# Patient Record
Sex: Male | Born: 1990 | Race: Black or African American | Hispanic: No | Marital: Single | Smoking: Never smoker
Health system: Southern US, Community
[De-identification: ages and names within clinical notes are randomized; demographics above are authoritative.]

## PROBLEM LIST (undated history)

## (undated) DIAGNOSIS — I10 Essential (primary) hypertension: Secondary | ICD-10-CM

## (undated) HISTORY — DX: Essential (primary) hypertension: I10

---

## 2013-04-20 ENCOUNTER — Emergency Department (HOSPITAL_COMMUNITY)
Admission: EM | Admit: 2013-04-20 | Discharge: 2013-04-20 | Disposition: A | Discharge status: Home / Self Care | Payer: Self-pay | Attending: Emergency Medicine | Admitting: Emergency Medicine

## 2013-04-20 ENCOUNTER — Encounter (HOSPITAL_COMMUNITY): Payer: Self-pay | Admitting: Emergency Medicine

## 2013-04-20 DIAGNOSIS — S0993XA Unspecified injury of face, initial encounter: Secondary | ICD-10-CM | POA: Insufficient documentation

## 2013-04-20 DIAGNOSIS — Y9389 Activity, other specified: Secondary | ICD-10-CM | POA: Insufficient documentation

## 2013-04-20 DIAGNOSIS — Y92410 Unspecified street and highway as the place of occurrence of the external cause: Secondary | ICD-10-CM | POA: Insufficient documentation
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
## 2013-04-21 NOTE — ED Provider Notes (Signed)
Medical screening examination/treatment/procedure(s) were performed by non-physician practitioner and as supervising physician I was immediately available for consultation/collaboration.   Derriona Branscom E Laquanta Hummel, MD 04/21/13 1313 

## 2016-01-07 ENCOUNTER — Ambulatory Visit (HOSPITAL_COMMUNITY)
Admission: EM | Admit: 2016-01-07 | Discharge: 2016-01-07 | Disposition: A | Discharge status: Home / Self Care | Payer: 59 | Attending: Emergency Medicine | Admitting: Emergency Medicine

## 2016-01-07 ENCOUNTER — Encounter (HOSPITAL_COMMUNITY): Payer: Self-pay

## 2016-01-07 DIAGNOSIS — Z202 Contact with and (suspected) exposure to infections with a predominantly sexual mode of transmission: Secondary | ICD-10-CM | POA: Diagnosis present

## 2016-01-07 DIAGNOSIS — N342 Other urethritis: Secondary | ICD-10-CM | POA: Diagnosis not present

## 2016-01-07 MED ORDER — AZITHROMYCIN 250 MG PO TABS
1000.0000 mg | ORAL_TABLET | Freq: Once | ORAL | Status: AC
  Administered 2016-01-07: 1000 mg via ORAL

## 2016-01-07 MED ORDER — CEFTRIAXONE SODIUM 250 MG IJ SOLR
INTRAMUSCULAR | Status: AC
  Filled 2016-01-07: qty 250

## 2016-01-07 MED ORDER — LIDOCAINE HCL (PF) 1 % IJ SOLN
INTRAMUSCULAR | Status: AC
  Filled 2016-01-07: qty 5

## 2016-01-07 MED ORDER — CEFTRIAXONE SODIUM 250 MG IJ SOLR
250.0000 mg | INTRAMUSCULAR | Status: DC
  Administered 2016-01-07: 250 mg via INTRAMUSCULAR

## 2016-01-07 MED ORDER — AZITHROMYCIN 250 MG PO TABS
ORAL_TABLET | ORAL | Status: AC
  Filled 2016-01-07: qty 4

## 2016-01-07 NOTE — Discharge Instructions (Signed)
Urethritis, Adult °Urethritis is an inflammation of the tube through which urine exits your bladder (urethra).  °CAUSES °Urethritis is often caused by an infection in your urethra. The infection can be viral, like herpes. The infection can also be bacterial, like gonorrhea. °RISK FACTORS °Risk factors of urethritis include: °· Having sex without using a condom. °· Having multiple sexual partners. °· Having poor hygiene. °SIGNS AND SYMPTOMS °Symptoms of urethritis are less noticeable in women than in men. These symptoms include: °· Burning feeling when you urinate (dysuria). °· Discharge from your urethra. °· Blood in your urine (hematuria). °· Urinating more than usual. °DIAGNOSIS  °To confirm a diagnosis of urethritis, your health care provider will do the following: °· Ask about your sexual history. °· Perform a physical exam. °· Have you provide a sample of your urine for lab testing. °· Use a cotton swab to gently collect a sample from your urethra for lab testing. °TREATMENT  °It is important to treat urethritis. Depending on the cause, untreated urethritis may lead to serious genital infections and possibly infertility. Urethritis caused by a bacterial infection is treated with antibiotic medicine. All sexual partners must be treated.  °HOME CARE INSTRUCTIONS °· Do not have sex until the test results are known and treatment is completed, even if your symptoms go away before you finish treatment. °· If you were prescribed an antibiotic, finish it all even if you start to feel better. °SEEK MEDICAL CARE IF:  °· Your symptoms are not improved in 3 days. °· Your symptoms are getting worse. °· You develop abdominal pain or pelvic pain (in women). °· You develop joint pain. °· You have a fever. °SEEK IMMEDIATE MEDICAL CARE IF:  °· You have severe pain in the belly, back, or side. °· You have repeated vomiting. °MAKE SURE YOU: °· Understand these instructions. °· Will watch your condition. °· Will get help right away  if you are not doing well or get worse. °  °This information is not intended to replace advice given to you by your health care provider. Make sure you discuss any questions you have with your health care provider. °  °Document Released: 02/15/2001 Document Revised: 01/06/2015 Document Reviewed: 04/22/2013 °Elsevier Interactive Patient Education ©2016 [employer] Inc[employer] °Safe Sex °Safe sex is about reducing the risk of giving or getting a sexually transmitted disease (STD). STDs are spread through sexual contact involving the genitals, mouth, or rectum. Some STDs can be cured and others cannot. Safe sex can also prevent unintended pregnancies.  °WHAT ARE SOME SAFE SEX PRACTICES? °· Limit your sexual activity to only one partner who is having sex with only you. °· Talk to your partner about his or her past partners, past STDs, and drug use. °· Use a condom every time you have sexual intercourse. This includes vaginal, oral, and anal sexual activity. Both females and males should wear condoms during oral sex. Only use latex or polyurethane condoms and water-based lubricants. Using petroleum-based lubricants or oils to lubricate a condom will weaken the condom and increase the chance that it will break. The condom should be in place from the beginning to the end of sexual activity. Wearing a condom reduces, but does not completely eliminate, your risk of getting or giving an STD. STDs can be spread by contact with infected body fluids and skin. °· Get vaccinated for hepatitis B and HPV. °· Avoid alcohol and recreational drugs, which can affect your judgment. You may forget to use a condom or participate   in high-risk sex. °· For females, avoid douching after sexual intercourse. Douching can spread an infection farther into the reproductive tract. °· Check your body for signs of sores, blisters, rashes, or unusual discharge. See your health care provider if you notice any of these signs. °· Avoid sexual contact if you have  symptoms of an infection or are being treated for an STD. If you or your partner has herpes, avoid sexual contact when blisters are present. Use condoms at all other times. °· If you are at risk of being infected with HIV, it is recommended that you take a prescription medicine daily to prevent HIV infection. This is called pre-exposure prophylaxis (PrEP). You are considered at risk if: °¨ You are a man who has sex with other men (MSM). °¨ You are a heterosexual man or woman who is sexually active with more than one partner. °¨ You take drugs by injection. °¨ You are sexually active with a partner who has HIV. °· Talk with your health care provider about whether you are at high risk of being infected with HIV. If you choose to begin PrEP, you should first be tested for HIV. You should then be tested every 3 months for as long as you are taking PrEP. °· See your health care provider for regular screenings, exams, and tests for other STDs. Before having sex with a new partner, each of you should be screened for STDs and should talk about the results with each other. °WHAT ARE THE BENEFITS OF SAFE SEX?  °· There is less chance of getting or giving an STD. °· You can prevent unwanted or unintended pregnancies. °· By discussing safe sex concerns with your partner, you may increase feelings of intimacy, comfort, trust, and honesty between the two of you. °  °This information is not intended to replace advice given to you by your health care provider. Make sure you discuss any questions you have with your health care provider. °  °Document Released: 09/29/2004 Document Revised: 09/12/2014 Document Reviewed: 02/13/2012 °Elsevier Interactive Patient Education ©2016 Elsevier Inc. ° °

## 2016-01-07 NOTE — ED Notes (Signed)
Patient presents with possible STD exposure and would like to have STD test. Patient states he has been having a thick white discharge x2 days and painful urination. No acute distress

## 2016-01-07 NOTE — ED Provider Notes (Addendum)
CSN: [id number]     Arrival date & time 01/07/16  1818 History   First MD Initiated Contact with Patient 01/07/16 1927     Chief Complaint  Patient presents with  . Exposure to STD   (Consider location/radiation/quality/duration/timing/severity/associated sxs/prior Treatment) Patient is a 25 y.o. male presenting with STD exposure. The history is provided by the patient. No language interpreter was used.  Exposure to STD This is a new problem. The current episode started 2 days ago. The problem occurs constantly. The problem has not changed since onset.Pertinent negatives include no headaches. Nothing aggravates the symptoms. Nothing relieves the symptoms. He has tried nothing for the symptoms. The treatment provided no relief.  Pt complains of white discharge  History reviewed. No pertinent past medical history. History reviewed. No pertinent past surgical history. No family history on file. Social History  Substance Use Topics  . Smoking status: Never Smoker   . Smokeless tobacco: Never Used  . Alcohol Use: No    Review of Systems  Neurological: Negative for headaches.  All other systems reviewed and are negative.   Allergies  Review of patient's allergies indicates no known allergies.  Home Medications   Prior to Admission medications   Not on File   Meds Ordered and Administered this Visit   Medications  azithromycin (ZITHROMAX) tablet 1,000 mg (1,000 mg Oral Given 01/07/16 2015)  Pt given zithromx and Rocephin due to possible std.  BP 145/87 mmHg  Pulse 66  Temp(Src) 97.9 F (36.6 C) (Oral)  SpO2 99% No data found.   Physical Exam  Constitutional: He is oriented to person, place, and time. He appears well-developed and well-nourished.  HENT:  Head: Normocephalic.  Eyes: EOM are normal.  Neck: Normal range of motion.  Pulmonary/Chest: Effort normal.  Abdominal: He exhibits no distension.  Genitourinary: Prostate normal.  circumsized no lesions,    Musculoskeletal: Normal range of motion.  Neurological: He is alert and oriented to person, place, and time.  Psychiatric: He has a normal mood and affect.  Nursing note and vitals reviewed.   ED Course  Procedures (including critical care time)  Labs Review Labs Reviewed  URINE CYTOLOGY ANCILLARY ONLY    Imaging Review No results found.   Visual Acuity Review  Right Eye Distance:   Left Eye Distance:   Bilateral Distance:    Right Eye Near:   Left Eye Near:    Bilateral Near:         MDM   1. Urethritis    Meds ordered this encounter  Medications  . cefTRIAXone (ROCEPHIN) injection 250 mg    Sig:     Order Specific Question:  Antibiotic Indication:    Answer:  STD  . azithromycin (ZITHROMAX) tablet 1,000 mg    Sig:   An After Visit Summary was printed and given to the patient.    Elson AreasLeslie K Sofia, PA-C 01/07/16 [address]2007  Leslie K SumnerSofia, [place] JerseyPA-C 01/12/16 [phone]

## 2016-01-09 LAB — URINE CYTOLOGY ANCILLARY ONLY
Chlamydia: NEGATIVE
Neisseria Gonorrhea: POSITIVE — AB
Trichomonas: NEGATIVE

## 2016-01-11 ENCOUNTER — Telehealth (HOSPITAL_COMMUNITY): Payer: Self-pay | Admitting: Emergency Medicine

## 2016-01-11 NOTE — ED Notes (Signed)
LM on pt's VM [phone] Need to give lab results from recent visit on 5/4 Also let pt know labs can be obtained from MyChart  Per Dr. Dayton ScrapeMurray,  Clinical staff, please let patient and health department know that test for gonorrhea was positive.  [facility] with IM rocephin 250mg  and po zithromax 1g, so no further treatment is needed at this time.  Patient should make sexual partners aware of need for testing/treatment. LM   [facility]

## 2016-01-13 NOTE — ED Notes (Signed)
Called pt and notified of recent lab results from visit 5/4 Pt ID'd properly... Reports feeling better and sx have subsided... Has notified partner/s  Per Dr. Dayton ScrapeMurray,  Clinical staff, please let patient and health department know that test for gonorrhea was positive.  [facility], with IM rocephin 250mg  and po zithromax 1g, so no further treatment is needed at this time.  Patient should make sexual partners aware of need for testing/treatment. LM  Adv pt if sx are not getting better to return  Pt verb understanding Education on safe sex given [facility] already.

## 2016-09-07 ENCOUNTER — Ambulatory Visit (HOSPITAL_COMMUNITY)
Admission: EM | Admit: 2016-09-07 | Discharge: 2016-09-07 | Disposition: A | Discharge status: Home / Self Care | Payer: 59 | Attending: Emergency Medicine | Admitting: Emergency Medicine

## 2016-09-07 ENCOUNTER — Encounter (HOSPITAL_COMMUNITY): Payer: Self-pay | Admitting: Emergency Medicine

## 2016-09-07 DIAGNOSIS — R1030 Lower abdominal pain, unspecified: Secondary | ICD-10-CM | POA: Insufficient documentation

## 2016-09-07 DIAGNOSIS — Z79899 Other long term (current) drug therapy: Secondary | ICD-10-CM | POA: Insufficient documentation

## 2016-09-07 DIAGNOSIS — N410 Acute prostatitis: Secondary | ICD-10-CM | POA: Diagnosis not present

## 2016-09-07 LAB — POCT URINALYSIS DIP (DEVICE)
BILIRUBIN URINE: NEGATIVE
Glucose, UA: NEGATIVE mg/dL
Hgb urine dipstick: NEGATIVE
Ketones, ur: NEGATIVE mg/dL
LEUKOCYTES UA: NEGATIVE
NITRITE: NEGATIVE
Protein, ur: NEGATIVE mg/dL
Specific Gravity, Urine: 1.025 (ref 1.005–1.030)
Urobilinogen, UA: >=8 mg/dL (ref 0.0–1.0)
pH: 6 (ref 5.0–8.0)

## 2016-09-07 MED ORDER — CIPROFLOXACIN HCL 500 MG PO TABS: 500.0000 mg | ORAL_TABLET | Freq: Two times a day (BID) | ORAL | 0 refills | Status: DC

## 2016-09-07 NOTE — ED Provider Notes (Signed)
CSN: [id number]     Arrival date & time 09/07/16  1709 History   First MD Initiated Contact with Patient 09/07/16 1826     Chief Complaint  Patient presents with  . Groin Pain   (Consider location/radiation/quality/duration/timing/severity/associated sxs/prior Treatment) Patient c/o painful intercourse for 2 weeks.  He denies any urethritis or urethral DC.   The history is provided by the patient.  Groin Pain  This is a new problem. The current episode started more than 1 week ago. The problem occurs constantly. The problem has not changed since onset.The symptoms are aggravated by intercourse. Nothing relieves the symptoms.    History reviewed. No pertinent past medical history. History reviewed. No pertinent surgical history. History reviewed. No pertinent family history. Social History  Substance Use Topics  . Smoking status: Never Smoker  . Smokeless tobacco: Never Used  . Alcohol use No    Review of Systems  Constitutional: Negative.   HENT: Negative.   Eyes: Negative.   Respiratory: Negative.   Cardiovascular: Negative.   Gastrointestinal: Negative.   Endocrine: Negative.   Genitourinary: Positive for discharge.  Musculoskeletal: Negative.   Allergic/Immunologic: Negative.   Neurological: Negative.   Hematological: Negative.   Psychiatric/Behavioral: Negative.     Allergies  Patient has no known allergies.  Home Medications   Prior to Admission medications   Medication Sig Start Date End Date Taking? Authorizing Provider  ciprofloxacin (CIPRO) 500 MG tablet Take 1 tablet (500 mg total) by mouth 2 (two) times daily. 09/07/16   Deatra CanterWilliam J Oxford, FNP   Meds Ordered and Administered this Visit  Medications - No data to display  BP 139/71 (BP Location: Left Arm)   Pulse 73   Temp 98.4 F (36.9 C) (Oral)   Resp 18   SpO2 100%  No data found.   Physical Exam  Constitutional: He appears well-developed and well-nourished.  HENT:  Head: Normocephalic and  atraumatic.  Eyes: EOM are normal. Pupils are equal, round, and reactive to light.  Neck: Normal range of motion. Neck supple.  Cardiovascular: Normal rate, regular rhythm and normal heart sounds.   Pulmonary/Chest: Effort normal and breath sounds normal.  Abdominal: Soft. Bowel sounds are normal.  Genitourinary: Penis normal.  Nursing note and vitals reviewed.   Urgent Care Course   Clinical Course     Procedures (including critical care time)  Labs Review Labs Reviewed  URINE CULTURE  POCT URINALYSIS DIP (DEVICE)  URINE CYTOLOGY ANCILLARY ONLY    Imaging Review No results found.   Visual Acuity Review  Right Eye Distance:   Left Eye Distance:   Bilateral Distance:    Right Eye Near:   Left Eye Near:    Bilateral Near:         MDM   1. Acute prostatitis    cipro 500mg  po bid x 14 days #28  UA UACX UA cytology - GC chlamydia, trich      Deatra CanterWilliam J Oxford, FNP 09/07/16 [phone]

## 2016-09-07 NOTE — ED Notes (Signed)
Clean & dirty urine specimen in lab

## 2016-09-07 NOTE — ED Triage Notes (Signed)
[facility] a [facility]. The patient denied any dysuria or penile discharge. This has been ongoing for 2 weeks.

## 2016-09-07 NOTE — ED Notes (Signed)
Clean and dirty urine collected

## 2016-09-08 LAB — URINE CYTOLOGY ANCILLARY ONLY
Chlamydia: NEGATIVE
Neisseria Gonorrhea: NEGATIVE
Trichomonas: NEGATIVE

## 2016-09-08 LAB — URINE CULTURE: Culture: NO GROWTH

## 2016-09-26 ENCOUNTER — Encounter (HOSPITAL_COMMUNITY): Payer: Self-pay | Admitting: Family Medicine

## 2016-09-26 ENCOUNTER — Ambulatory Visit (HOSPITAL_COMMUNITY)
Admission: EM | Admit: 2016-09-26 | Discharge: 2016-09-26 | Disposition: A | Discharge status: Home / Self Care | Payer: 59 | Attending: Family Medicine | Admitting: Family Medicine

## 2016-09-26 DIAGNOSIS — R197 Diarrhea, unspecified: Secondary | ICD-10-CM

## 2016-09-26 NOTE — Discharge Instructions (Signed)
Take probiotics otc

## 2016-09-26 NOTE — ED Triage Notes (Signed)
Pt here for diarrhea over the past 2 weeks. sts some relief with pepto bismol. Denies vomiting.

## 2016-09-26 NOTE — ED Provider Notes (Signed)
CSN: [id number]     Arrival date & time 09/26/16  1353 History   First MD Initiated Contact with Patient 09/26/16 1440     Chief Complaint  Patient presents with  . Diarrhea   (Consider location/radiation/quality/duration/timing/severity/associated sxs/prior Treatment) Patient c/o diarrhea that has been improving but today it became worst.   The history is provided by the patient.  Diarrhea  Quality:  Watery Severity:  Moderate Onset quality:  Sudden Duration:  2 weeks Timing:  Intermittent Progression:  Improving Relieved by:  Nothing Worsened by:  Nothing   History reviewed. No pertinent past medical history. History reviewed. No pertinent surgical history. History reviewed. No pertinent family history. Social History  Substance Use Topics  . Smoking status: Never Smoker  . Smokeless tobacco: Never Used  . Alcohol use No    Review of Systems  Constitutional: Negative.   HENT: Negative.   Eyes: Negative.   Respiratory: Negative.   Cardiovascular: Negative.   Gastrointestinal: Positive for diarrhea.  Endocrine: Negative.   Genitourinary: Negative.   Musculoskeletal: Negative.   Allergic/Immunologic: Negative.   Neurological: Negative.   Hematological: Negative.   Psychiatric/Behavioral: Negative.     Allergies  Patient has no known allergies.  Home Medications   Prior to Admission medications   Not on File   Meds Ordered and Administered this Visit  Medications - No data to display  BP 131/75   Pulse 75   Temp 97.8 F (36.6 C)   Resp 18   SpO2 100%  No data found.   Physical Exam  Constitutional: He is oriented to person, place, and time. He appears well-developed and well-nourished.  HENT:  Head: Normocephalic and atraumatic.  Right Ear: External ear normal.  Left Ear: External ear normal.  Mouth/Throat: Oropharynx is clear and moist.  Eyes: Conjunctivae and EOM are normal. Pupils are equal, round, and reactive to light.  Neck: Normal  range of motion. Neck supple.  Cardiovascular: Normal rate, regular rhythm and normal heart sounds.   Pulmonary/Chest: Effort normal and breath sounds normal.  Neurological: He is alert and oriented to person, place, and time.  Nursing note and vitals reviewed.   Urgent Care Course     Procedures (including critical care time)  Labs Review Labs Reviewed - No data to display  Imaging Review No results found.   Visual Acuity Review  Right Eye Distance:   Left Eye Distance:   Bilateral Distance:    Right Eye Near:   Left Eye Near:    Bilateral Near:         MDM   1. Diarrhea, unspecified type    Probiotics and tincture of time Work Note      Deatra CanterWilliam J Ezekiah Massie, [place]FNP 09/26/16 1523

## 2019-05-21 ENCOUNTER — Ambulatory Visit (HOSPITAL_COMMUNITY)
Admission: EM | Admit: 2019-05-21 | Discharge: 2019-05-21 | Disposition: A | Discharge status: Home / Self Care | Payer: PRIVATE HEALTH INSURANCE | Attending: Urgent Care | Admitting: Urgent Care

## 2019-05-21 ENCOUNTER — Encounter (HOSPITAL_COMMUNITY): Payer: Self-pay | Admitting: Urgent Care

## 2019-05-21 ENCOUNTER — Other Ambulatory Visit: Payer: Self-pay

## 2019-05-21 DIAGNOSIS — Z7251 High risk heterosexual behavior: Secondary | ICD-10-CM | POA: Insufficient documentation

## 2019-05-21 DIAGNOSIS — I160 Hypertensive urgency: Secondary | ICD-10-CM

## 2019-05-21 DIAGNOSIS — I10 Essential (primary) hypertension: Secondary | ICD-10-CM | POA: Diagnosis present

## 2019-05-21 DIAGNOSIS — Z113 Encounter for screening for infections with a predominantly sexual mode of transmission: Secondary | ICD-10-CM | POA: Diagnosis present

## 2019-05-21 MED ORDER — AMLODIPINE BESYLATE 5 MG PO TABS: 5.0000 mg | ORAL_TABLET | Freq: Every day | ORAL | 0 refills | Status: DC

## 2019-05-21 NOTE — Discharge Instructions (Addendum)

## 2019-05-21 NOTE — ED Triage Notes (Signed)
Pt here requesting STD screening; denies sx

## 2019-05-21 NOTE — ED Provider Notes (Signed)
MRN: [id number] DOB: [birth year]  Subjective:   Charles FranklinJamale Vang is a 28 y.o. male presenting for STI check. Patient is having unprotected sex, 2 male partners. Has had BP readings in the 140s with checks at work. Has tried in the past to work on his BP only through exercise and diet. He has not been as compliant lately. Does not have PCP. He is not currently taking any medications and has no known food or drug allergies.  Denies past medical and surgical history.  Review of Systems  Constitutional: Negative for chills, fever and malaise/fatigue.  HENT: Negative for congestion, ear pain, sinus pain and sore throat.   Eyes: Negative for blurred vision, double vision, discharge and redness.  Respiratory: Negative for cough, hemoptysis, shortness of breath and wheezing.   Cardiovascular: Negative for chest pain.  Gastrointestinal: Negative for abdominal pain, diarrhea, nausea and vomiting.  Genitourinary: Negative for dysuria, flank pain, frequency, hematuria and urgency.  Musculoskeletal: Negative for myalgias.  Skin: Negative for rash.  Neurological: Negative for dizziness, tingling, weakness and headaches.  Psychiatric/Behavioral: Negative for depression and substance abuse.    Objective:   Vitals: BP (!) 187/114 (BP Location: Left Arm)   Pulse 73   Temp 98.7 F (37.1 C) (Oral)   Resp 17   SpO2 98%   BP Readings from Last 3 Encounters:  05/21/19 (!) 187/114  09/26/16 131/75  09/07/16 139/71   Physical Exam Constitutional:      General: He is not in acute distress.    Appearance: Normal appearance. He is well-developed. He is not ill-appearing, toxic-appearing or diaphoretic.  HENT:     Head: Normocephalic and atraumatic.     Right Ear: External ear normal.     Left Ear: External ear normal.     Nose: Nose normal.     Mouth/Throat:     Mouth: Mucous membranes are moist.     Pharynx: Oropharynx is clear.  Eyes:     General: No scleral icterus.    Extraocular Movements:  Extraocular movements intact.     Pupils: Pupils are equal, round, and reactive to light.  Neck:     Musculoskeletal: Normal range of motion and neck supple.  Cardiovascular:     Rate and Rhythm: Normal rate and regular rhythm.     Heart sounds: Normal heart sounds. No murmur. No friction rub. No gallop.   Pulmonary:     Effort: Pulmonary effort is normal. No respiratory distress.     Breath sounds: Normal breath sounds. No stridor. No wheezing, rhonchi or rales.  Genitourinary:    Penis: Circumcised. No phimosis, paraphimosis, hypospadias, erythema, tenderness, discharge, swelling or lesions.   Musculoskeletal:     Right lower leg: No edema.     Left lower leg: No edema.  Skin:    General: Skin is warm and dry.  Neurological:     Mental Status: He is alert and oriented to person, place, and time.     Cranial Nerves: No cranial nerve deficit.     Motor: No weakness.     Coordination: Coordination normal.     Gait: Gait normal.     Deep Tendon Reflexes: Reflexes normal.  Psychiatric:        Mood and Affect: Mood normal.        Behavior: Behavior normal.        Thought Content: Thought content normal.        Judgment: Judgment normal.     Assessment and Plan :  1. Hypertensive urgency   2. Essential hypertension   3. Screen for STD (sexually transmitted disease)   4. Unprotected sex     Patient has completely normal physical exam findings and is asymptomatic.  Counseled patient extensively on need for medical management in addition to dietary changes for his essential hypertension.  Patient will establish care with [facility] to obtain a PCP and continue management of his blood pressure.  We will start him on amlodipine at 5 mg.  STI testing is pending, counseled on safe sex practices. Counseled patient on potential for adverse effects with medications prescribed/recommended today, ER and return-to-clinic precautions discussed, patient verbalized  understanding.    Jaynee Eagles, PA-C 05/21/19 2000

## 2019-05-22 LAB — HIV ANTIBODY (ROUTINE TESTING W REFLEX): HIV Screen 4th Generation wRfx: NONREACTIVE

## 2019-05-22 LAB — RPR: RPR Ser Ql: NONREACTIVE

## 2019-05-23 LAB — CYTOLOGY, (ORAL, ANAL, URETHRAL) ANCILLARY ONLY
Chlamydia: NEGATIVE
Neisseria Gonorrhea: NEGATIVE
Trichomonas: NEGATIVE

## 2019-06-13 ENCOUNTER — Other Ambulatory Visit: Payer: Self-pay

## 2019-06-13 DIAGNOSIS — Z20822 Contact with and (suspected) exposure to covid-19: Secondary | ICD-10-CM

## 2019-06-14 LAB — NOVEL CORONAVIRUS, NAA: SARS-CoV-2, NAA: NOT DETECTED

## 2020-04-03 ENCOUNTER — Ambulatory Visit: Payer: 59 | Attending: Internal Medicine

## 2020-04-03 DIAGNOSIS — Z20822 Contact with and (suspected) exposure to covid-19: Secondary | ICD-10-CM

## 2020-04-04 LAB — SARS-COV-2, NAA 2 DAY TAT

## 2020-04-04 LAB — NOVEL CORONAVIRUS, NAA: SARS-CoV-2, NAA: DETECTED — AB

## 2020-04-05 ENCOUNTER — Other Ambulatory Visit: Payer: Self-pay | Admitting: Adult Health

## 2020-04-05 DIAGNOSIS — U071 COVID-19: Secondary | ICD-10-CM

## 2020-04-05 NOTE — Progress Notes (Signed)
I connected by phone with Charles Vang on 04/05/2020 at 4:48 PM to discuss the potential use of a new treatment for mild to moderate COVID-19 viral infection in non-hospitalized patients.  This patient is a 29 y.o. male that meets the FDA criteria for Emergency Use Authorization of COVID monoclonal antibody casirivimab/imdevimab.  Has a (+) direct SARS-CoV-2 viral test result  Has mild or moderate COVID-19   Is NOT hospitalized due to COVID-19  Is within 10 days of symptom onset  Has at least one of the high risk factor(s) for progression to severe COVID-19 and/or hospitalization as defined in EUA.  Specific high risk criteria : Cardiovascular disease or hypertension   I have spoken and communicated the following to the patient or parent/caregiver regarding COVID monoclonal antibody treatment:  1. FDA has authorized the emergency use for the treatment of mild to moderate COVID-19 in adults and pediatric patients with positive results of direct SARS-CoV-2 viral testing who are 21 years of age and older weighing at least 40 kg, and who are at high risk for progressing to severe COVID-19 and/or hospitalization.  2. The significant known and potential risks and benefits of COVID monoclonal antibody, and the extent to which such potential risks and benefits are unknown.  3. Information on available alternative treatments and the risks and benefits of those alternatives, including clinical trials.  4. Patients treated with COVID monoclonal antibody should continue to self-isolate and use infection control measures (e.g., wear mask, isolate, social distance, avoid sharing personal items, clean and disinfect high touch surfaces, and frequent handwashing) according to CDC guidelines.   5. The patient or parent/caregiver has the option to accept or refuse COVID monoclonal antibody treatment.  After reviewing this information with the patient, The patient agreed to proceed with receiving  casirivimab\imdevimab infusion and will be provided a copy of the Fact sheet prior to receiving the infusion. Noreene Filbert 04/05/2020 4:48 PM

## 2020-04-06 MED ORDER — SODIUM CHLORIDE 0.9 % IV SOLN
Freq: Once | INTRAVENOUS | Status: AC
  Filled 2020-04-06: qty 600

## 2020-04-07 ENCOUNTER — Ambulatory Visit (HOSPITAL_COMMUNITY)
Admission: RE | Admit: 2020-04-07 | Discharge: 2020-04-07 | Disposition: A | Discharge status: Home / Self Care | Payer: PRIVATE HEALTH INSURANCE | Source: Ambulatory Visit | Attending: Pulmonary Disease | Admitting: Pulmonary Disease

## 2020-04-07 DIAGNOSIS — U071 COVID-19: Secondary | ICD-10-CM | POA: Insufficient documentation

## 2020-04-07 MED ORDER — FAMOTIDINE IN NACL 20-0.9 MG/50ML-% IV SOLN: 20.0000 mg | Freq: Once | INTRAVENOUS | Status: DC | PRN

## 2020-04-07 MED ORDER — SODIUM CHLORIDE 0.9 % IV SOLN: INTRAVENOUS | Status: DC | PRN

## 2020-04-07 MED ORDER — DIPHENHYDRAMINE HCL 50 MG/ML IJ SOLN: 50.0000 mg | Freq: Once | INTRAMUSCULAR | Status: DC | PRN

## 2020-04-07 MED ORDER — METHYLPREDNISOLONE SODIUM SUCC 125 MG IJ SOLR: 125.0000 mg | Freq: Once | INTRAMUSCULAR | Status: DC | PRN

## 2020-04-07 MED ORDER — EPINEPHRINE 0.3 MG/0.3ML IJ SOAJ: 0.3000 mg | Freq: Once | INTRAMUSCULAR | Status: DC | PRN

## 2020-04-07 MED ORDER — ALBUTEROL SULFATE HFA 108 (90 BASE) MCG/ACT IN AERS: 2.0000 | INHALATION_SPRAY | Freq: Once | RESPIRATORY_TRACT | Status: DC | PRN

## 2020-04-07 NOTE — Progress Notes (Signed)
  Diagnosis: COVID-19  Physician: Dr. Wright  Procedure: [facility]fusion [facility] Med: casirivimab\imdevimab infusion - Provided patient with casirivimab\imdevimab fact sheet for patients, parents and caregivers prior to infusion.  Complications: No immediate complications noted.  Discharge: Discharged home   Charles Vang M Celia Gibbons 04/07/2020  

## 2020-04-07 NOTE — Discharge Instructions (Signed)

## 2020-05-25 ENCOUNTER — Encounter (HOSPITAL_COMMUNITY): Payer: Self-pay

## 2020-05-25 ENCOUNTER — Ambulatory Visit (HOSPITAL_COMMUNITY)
Admission: RE | Admit: 2020-05-25 | Discharge: 2020-05-25 | Disposition: A | Discharge status: Home / Self Care | Payer: PRIVATE HEALTH INSURANCE | Source: Ambulatory Visit | Attending: Internal Medicine | Admitting: Internal Medicine

## 2020-05-25 ENCOUNTER — Other Ambulatory Visit: Payer: Self-pay

## 2020-05-25 VITALS — BP 136/93 | HR 66 | Temp 98.4°F | Resp 16

## 2020-05-25 DIAGNOSIS — Z760 Encounter for issue of repeat prescription: Secondary | ICD-10-CM | POA: Diagnosis not present

## 2020-05-25 MED ORDER — AMLODIPINE BESYLATE 5 MG PO TABS: 5.0000 mg | ORAL_TABLET | Freq: Every day | ORAL | 2 refills | Status: AC

## 2020-05-25 NOTE — ED Triage Notes (Signed)
Need refill on htn medication ,  Out of refill since 10/2019.

## 2020-05-26 NOTE — ED Provider Notes (Signed)
[facility]    CSN: [id number] Arrival date & time: 05/25/20  1856      History   Chief Complaint Chief Complaint  Patient presents with  . Medication Refill    HPI Charles Vang is a 29 y.o. male with a history of hypertension comes to urgent care for medication refill.  Patient has been out of his medications since February 2021.  He complains of occasional headaches.  No chest pain or abdominal pain.   HPI  Past Medical History:  Diagnosis Date  . Hypertension     There are no problems to display for this patient.   History reviewed. No pertinent surgical history.     Home Medications    Prior to Admission medications   Medication Sig Start Date End Date Taking? Authorizing Provider  amLODipine (NORVASC) 5 MG tablet Take 1 tablet (5 mg total) by mouth daily. 05/25/20   Woods Gangemi, Britta Mccreedy, MD    Family History History reviewed. No pertinent family history.  Social History Social History   Tobacco Use  . Smoking status: Never Smoker  . Smokeless tobacco: Never Used  Vaping Use  . Vaping Use: Never used  Substance Use Topics  . Alcohol use: No  . Drug use: No     Allergies   Patient has no known allergies.   Review of Systems Review of Systems  Respiratory: Negative for shortness of breath.   Cardiovascular: Negative for chest pain.  Gastrointestinal: Negative for abdominal pain.  Neurological: Positive for headaches.     Physical Exam Triage Vital Signs ED Triage Vitals  Enc Vitals Group     BP 05/25/20 1908 (!) 136/93     Pulse Rate 05/25/20 1908 66     Resp 05/25/20 1908 16     Temp 05/25/20 1908 98.4 F (36.9 C)     Temp Source 05/25/20 1908 Oral     SpO2 05/25/20 1908 97 %     Weight --      Height --      Head Circumference --      Peak Flow --      Pain Score 05/25/20 1913 0     Pain Loc --      Pain Edu? --      Excl. in GC? --    No data found.  Updated Vital Signs BP (!) 136/93 (BP Location: Right Arm)    Pulse 66   Temp 98.4 F (36.9 C) (Oral)   Resp 16   SpO2 97%   Visual Acuity Right Eye Distance:   Left Eye Distance:   Bilateral Distance:    Right Eye Near:   Left Eye Near:    Bilateral Near:     Physical Exam Vitals and nursing note reviewed.  Constitutional:      General: He is not in acute distress.    Appearance: He is not ill-appearing.  Cardiovascular:     Rate and Rhythm: Normal rate and regular rhythm.     Pulses: Normal pulses.     Heart sounds: Normal heart sounds.  Pulmonary:     Effort: Pulmonary effort is normal.     Breath sounds: Normal breath sounds.  Neurological:     Mental Status: He is alert.      UC Treatments / Results  Labs (all labs ordered are listed, but only abnormal results are displayed) Labs Reviewed - No data to display  EKG   Radiology No results found.  Procedures Procedures (  including critical care time)  Medications Ordered in UC Medications - No data to display  Initial Impression / Assessment and Plan / UC Course  I have reviewed the triage vital signs and the nursing notes.  Pertinent labs & imaging results that were available during my care of the patient were reviewed by me and considered in my medical decision making (see chart for details).     1.  Encounter for medication refill Refilled amlodipine 5 mg orally daily Patient encouraged to establish primary care relationship with [facility] health internal medicine program Patient is agreeable. Final Clinical Impressions(s) / UC Diagnoses   Final diagnoses:  Encounter for medication refill   Discharge Instructions   None    ED Prescriptions    Medication Sig Dispense Auth. Provider   amLODipine (NORVASC) 5 MG tablet Take 1 tablet (5 mg total) by mouth daily. 90 tablet Johnanthony Wilden, Britta Mccreedy, MD     PDMP not reviewed this encounter.   Merrilee Jansky, MD 05/26/20 2130

## 2020-08-21 ENCOUNTER — Other Ambulatory Visit: Payer: Self-pay

## 2020-08-21 ENCOUNTER — Encounter (HOSPITAL_COMMUNITY): Payer: Self-pay

## 2020-08-21 ENCOUNTER — Ambulatory Visit (INDEPENDENT_AMBULATORY_CARE_PROVIDER_SITE_OTHER): Payer: PRIVATE HEALTH INSURANCE

## 2020-08-21 ENCOUNTER — Ambulatory Visit (HOSPITAL_COMMUNITY)
Admission: EM | Admit: 2020-08-21 | Discharge: 2020-08-21 | Disposition: A | Discharge status: Home / Self Care | Payer: PRIVATE HEALTH INSURANCE

## 2020-08-21 ENCOUNTER — Ambulatory Visit (HOSPITAL_COMMUNITY): Payer: Self-pay

## 2020-08-21 DIAGNOSIS — R079 Chest pain, unspecified: Secondary | ICD-10-CM

## 2020-08-21 DIAGNOSIS — R519 Headache, unspecified: Secondary | ICD-10-CM

## 2020-08-21 DIAGNOSIS — R0789 Other chest pain: Secondary | ICD-10-CM

## 2020-08-21 MED ORDER — ALUM & MAG HYDROXIDE-SIMETH 200-200-20 MG/5ML PO SUSP
ORAL | Status: AC
  Filled 2020-08-21: qty 30

## 2020-08-21 MED ORDER — LIDOCAINE VISCOUS HCL 2 % MT SOLN
OROMUCOSAL | Status: AC
  Filled 2020-08-21: qty 15

## 2020-08-21 MED ORDER — OMEPRAZOLE 20 MG PO CPDR: 20.0000 mg | DELAYED_RELEASE_CAPSULE | Freq: Every day | ORAL | 0 refills | Status: AC

## 2020-08-21 MED ORDER — LIDOCAINE VISCOUS HCL 2 % MT SOLN
15.0000 mL | Freq: Once | OROMUCOSAL | Status: AC
  Administered 2020-08-21: 14:00:00 15 mL via ORAL

## 2020-08-21 MED ORDER — ALUM & MAG HYDROXIDE-SIMETH 200-200-20 MG/5ML PO SUSP
30.0000 mL | Freq: Once | ORAL | Status: AC
  Administered 2020-08-21: 14:00:00 30 mL via ORAL

## 2020-08-21 NOTE — ED Triage Notes (Signed)
Pt presents with intermittent chest pain, headache and lightheadedness x 4 days. States he started taking medications for high blood pressure 5 days and the symptoms started the next day.

## 2020-08-21 NOTE — ED Provider Notes (Signed)
[facility]    CSN: [id number] Arrival date & time: 08/21/20  1249      History   Chief Complaint Chief Complaint  Patient presents with   Appointment    1300   Dizziness   Headache   Chest Pain    HPI Charles Vang is a 29 y.o. male.   Charles Vang presents with complaints of chest pain and headache. Symptoms started 5 days ago. Headache is to bilateral temples. He states he will feel "foggy" with the headache, but no nausea or vomiting. No vision changes. No specific dizziness. Intermittent sternal chest pain without trigger. No shortness of breath . No calf pain, swelling, redness or warmth.  No pain with inspiration. Eating helps the pain. States symptoms started after restarting his blood pressure medication. He states he hadn't taken his medication for approximately 2 weeks, and then he restarted it and has felt unwell since. He hasn't taken his blood pressure medication today. Mild chest pain. No current headache. He does not have a PCP but has been prescribed his blood pressure medication here in urgent care. He doesn't have a reliable blood pressure cough to check his bp at home. Denies URI symptoms. Has been vaccinated and has had covid in the past.     ROS per HPI, negative if not otherwise mentioned.      Past Medical History:  Diagnosis Date   Hypertension     There are no problems to display for this patient.   History reviewed. No pertinent surgical history.     Home Medications    Prior to Admission medications   Medication Sig Start Date End Date Taking? Authorizing Provider  amLODipine (NORVASC) 5 MG tablet Take 1 tablet (5 mg total) by mouth daily. 05/25/20   LampteyBritta Mccreedy, MD  Ascorbic Acid (VITAMIN C) 1000 MG tablet Take 1,000 mg by mouth daily.    [provider]  magnesium 30 MG tablet Take 30 mg by mouth 2 (two) times daily.    [provider]  omeprazole (PRILOSEC) 20 MG capsule Take 1 capsule  (20 mg total) by mouth daily. 08/21/20   Georgetta Haber, NP  VITAMIN D PO Take by mouth.    [provider]    Family History History reviewed. No pertinent family history.  Social History Social History   Tobacco Use   Smoking status: Never Smoker   Smokeless tobacco: Never Used  Building services engineer Use: Never used  Substance Use Topics   Alcohol use: No   Drug use: No     Allergies   Patient has no known allergies.   Review of Systems Review of Systems   Physical Exam Triage Vital Signs ED Triage Vitals  Enc Vitals Group     BP 08/21/20 1314 133/75     Pulse Rate 08/21/20 1314 85     Resp 08/21/20 1314 18     Temp 08/21/20 1314 98.2 F (36.8 C)     Temp Source 08/21/20 1314 Oral     SpO2 08/21/20 1314 98 %     Weight --      Height --      Head Circumference --      Peak Flow --      Pain Score 08/21/20 1316 4     Pain Loc --      Pain Edu? --      Excl. in GC? --    No data found.  Updated  Vital Signs BP 133/75 (BP Location: Left Arm)    Pulse 85    Temp 98.2 F (36.8 C) (Oral)    Resp 18    SpO2 98%   Visual Acuity Right Eye Distance:   Left Eye Distance:   Bilateral Distance:    Right Eye Near:   Left Eye Near:    Bilateral Near:     Physical Exam Constitutional:      Appearance: He is well-developed.  Eyes:     Extraocular Movements: Extraocular movements intact.     Pupils: Pupils are equal, round, and reactive to light.  Cardiovascular:     Rate and Rhythm: Normal rate and regular rhythm.  Pulmonary:     Effort: Pulmonary effort is normal.     Breath sounds: Normal breath sounds.  Chest:     Chest wall: No tenderness.  Musculoskeletal:     Comments: Calves are soft and non tender   Skin:    General: Skin is warm and dry.  Neurological:     Mental Status: He is alert and oriented to person, place, and time.    EKG:  NSR rate of 71 . Previous EKG was not available for review. No stwave changes as interpreted by  me.    UC Treatments / Results  Labs (all labs ordered are listed, but only abnormal results are displayed) Labs Reviewed - No data to display  EKG   Radiology DG Chest 2 View  Result Date: 08/21/2020 CLINICAL DATA:  Chest pain. EXAM: CHEST - 2 VIEW COMPARISON:  None. FINDINGS: The heart size and mediastinal contours are within normal limits. Both lungs are clear. No pneumothorax or pleural effusion is noted. The visualized skeletal structures are unremarkable. IMPRESSION: No active cardiopulmonary disease. Electronically Signed   By: Lupita Raider M.D.   On: 08/21/2020 14:01    Procedures Procedures (including critical care time)  Medications Ordered in UC Medications  alum & mag hydroxide-simeth (MAALOX/MYLANTA) 200-200-20 MG/5ML suspension 30 mL (30 mLs Oral Given 08/21/20 1402)    And  lidocaine (XYLOCAINE) 2 % viscous mouth solution 15 mL (15 mLs Oral Given 08/21/20 1402)    Initial Impression / Assessment and Plan / UC Course  I have reviewed the triage vital signs and the nursing notes.  Pertinent labs & imaging results that were available during my care of the patient were reviewed by me and considered in my medical decision making (see chart for details).     Noted BP of 133/75 today despite not taking his prescribed amlodipine. Per chart review was seen her 9/20 when he was out of his medications, complained of intermittent headaches at that time as well, BP of 136/93. ekg reassuring. No indication of pe at this time, no shortness of breath , no dvt findings, no tachycardia. Chest xray without acute findings. Omeprazole initiated to see if this is helpful with pain and encouraged follow up with PCP. Return precautions provided. Patient verbalized understanding and agreeable to plan. Recommend return next week for recheck of BP, ok to remain off of medications, will reevaluate on recheck if needs to be restarted. Patient verbalized understanding and agreeable to plan.  Ambulatory out of clinic without difficulty.     Final Clinical Impressions(s) / UC Diagnoses   Final diagnoses:  Intermittent headache  Atypical chest pain     Discharge Instructions     Go ahead and stop your blood pressure medication, lets see how you feel while off of it, and  recheck your blood pressure next week- return here for BP recheck to discuss if need to re start your blood pressure medication.  See provided information about managing blood pressure.  Tylenol as needed for headaches.  We will start daily omeprazole to see if this is helpful with your chest pain, as well.  Please call today to establish with a primary care provider for long term management of your blood pressure.     ED Prescriptions    Medication Sig Dispense Auth. Provider   omeprazole (PRILOSEC) 20 MG capsule Take 1 capsule (20 mg total) by mouth daily. 30 capsule Georgetta Haber, NP     PDMP not reviewed this encounter.   Georgetta Haber, NP 08/21/20 1407

## 2020-08-21 NOTE — Discharge Instructions (Addendum)
Go ahead and stop your blood pressure medication, lets see how you feel while off of it, and recheck your blood pressure next week- return here for BP recheck to discuss if need to re start your blood pressure medication.  See provided information about managing blood pressure.  Tylenol as needed for headaches.  We will start daily omeprazole to see if this is helpful with your chest pain, as well.  Please call today to establish with a primary care provider for long term management of your blood pressure.

## 2020-10-27 ENCOUNTER — Ambulatory Visit (HOSPITAL_COMMUNITY): Payer: Self-pay

## 2020-12-09 ENCOUNTER — Other Ambulatory Visit: Payer: Self-pay

## 2020-12-09 ENCOUNTER — Ambulatory Visit (INDEPENDENT_AMBULATORY_CARE_PROVIDER_SITE_OTHER): Payer: PRIVATE HEALTH INSURANCE

## 2020-12-09 ENCOUNTER — Ambulatory Visit (HOSPITAL_COMMUNITY)
Admission: RE | Admit: 2020-12-09 | Discharge: 2020-12-09 | Disposition: A | Discharge status: Home / Self Care | Payer: PRIVATE HEALTH INSURANCE | Source: Ambulatory Visit | Attending: Urgent Care | Admitting: Urgent Care

## 2020-12-09 ENCOUNTER — Encounter (HOSPITAL_COMMUNITY): Payer: Self-pay

## 2020-12-09 VITALS — BP 126/82 | HR 82 | Temp 99.0°F | Resp 18

## 2020-12-09 DIAGNOSIS — R0602 Shortness of breath: Secondary | ICD-10-CM

## 2020-12-09 DIAGNOSIS — R079 Chest pain, unspecified: Secondary | ICD-10-CM | POA: Diagnosis not present

## 2020-12-09 DIAGNOSIS — R0789 Other chest pain: Secondary | ICD-10-CM | POA: Diagnosis not present

## 2020-12-09 DIAGNOSIS — M546 Pain in thoracic spine: Secondary | ICD-10-CM

## 2020-12-09 MED ORDER — FAMOTIDINE 20 MG PO TABS: 20.0000 mg | ORAL_TABLET | Freq: Two times a day (BID) | ORAL | 0 refills | Status: AC

## 2020-12-09 MED ORDER — TIZANIDINE HCL 4 MG PO TABS: 4.0000 mg | ORAL_TABLET | Freq: Three times a day (TID) | ORAL | 0 refills | Status: AC | PRN

## 2020-12-09 MED ORDER — NAPROXEN 500 MG PO TABS
500.0000 mg | ORAL_TABLET | Freq: Two times a day (BID) | ORAL | 0 refills | Status: AC
Start: 2020-12-09 — End: ?

## 2020-12-09 NOTE — ED Triage Notes (Addendum)
Chest pain since Monday, which has since moved toward pt's side, then to pt's back. He states that he took a new pre-workout on Sunday and unsure if this could be related. Pt tried heartburn medication but states this did not help. Pain is now only in middle of pt's back.

## 2020-12-09 NOTE — ED Provider Notes (Signed)
[facility]   MRN: [id number] DOB: 1991-05-16  Subjective:   Charles Vang is a 30 y.o. male presenting for 3-day history of acute onset persistent midsternal chest pain that is improved but is still fairly constant, intermittent shortness of breath.  He also started to have right-sided lateral chest pain and thoracic back pain.  Patient states that he thought it was acid reflux and took an off brand over-the-counter medication for this.  He also notes that he did do a workout on Sunday prior to his symptoms starting.  Denies fever, fall, trauma, heart racing, palpitations, belly pain, nausea, vomiting.  He does have a history of hypertension and takes medication for this.  He also has a history of acid reflux and is supposed to be on omeprazole but cannot say for sure if this is the medicine use.  Denies smoking cigarettes.  No history of lung disorders.  No current facility-administered medications for this encounter.  Current Outpatient Medications:  .  amLODipine (NORVASC) 5 MG tablet, Take 1 tablet (5 mg total) by mouth daily., Disp: 90 tablet, Rfl: 2 .  Ascorbic Acid (VITAMIN C) 1000 MG tablet, Take 1,000 mg by mouth daily., Disp: , Rfl:  .  magnesium 30 MG tablet, Take 30 mg by mouth 2 (two) times daily., Disp: , Rfl:  .  omeprazole (PRILOSEC) 20 MG capsule, Take 1 capsule (20 mg total) by mouth daily., Disp: 30 capsule, Rfl: 0 .  VITAMIN D PO, Take by mouth., Disp: , Rfl:    No Known Allergies  Past Medical History:  Diagnosis Date  . Hypertension      History reviewed. No pertinent surgical history.  Family History  Problem Relation Age of Onset  . Healthy Mother   . Diabetes Mother   . Hypertension Mother   . Healthy Father   . Diabetes Father   . Hypertension Father     Social History   Tobacco Use  . Smoking status: Never Smoker  . Smokeless tobacco: Never Used  Vaping Use  . Vaping Use: Never used  Substance Use Topics  . Alcohol use:  No  . Drug use: No    ROS   Objective:   Vitals: BP 126/82   Pulse 82   Temp 99 F (37.2 C)   Resp 18   SpO2 95%   Physical Exam Constitutional:      General: He is not in acute distress.    Appearance: Normal appearance. He is well-developed. He is not ill-appearing, toxic-appearing or diaphoretic.  HENT:     Head: Normocephalic and atraumatic.     Right Ear: External ear normal.     Left Ear: External ear normal.     Nose: Nose normal.     Mouth/Throat:     Mouth: Mucous membranes are moist.     Pharynx: Oropharynx is clear.  Eyes:     General: No scleral icterus.    Extraocular Movements: Extraocular movements intact.     Pupils: Pupils are equal, round, and reactive to light.  Cardiovascular:     Rate and Rhythm: Normal rate and regular rhythm.     Heart sounds: Normal heart sounds. No murmur heard. No friction rub. No gallop.   Pulmonary:     Effort: Pulmonary effort is normal. No respiratory distress.     Breath sounds: Normal breath sounds. No stridor. No wheezing, rhonchi or rales.  Chest:     Chest wall: No tenderness.  Neurological:  Mental Status: He is alert and oriented to person, place, and time.     Motor: No weakness.     Coordination: Coordination normal.     Gait: Gait normal.     Deep Tendon Reflexes: Reflexes normal.  Psychiatric:        Mood and Affect: Mood normal.        Behavior: Behavior normal.        Thought Content: Thought content normal.     ED ECG REPORT   Date: 12/09/2020  Rate: 82bpm  Rhythm: normal sinus rhythm  QRS Axis: normal  Intervals: normal  ST/T Wave abnormalities: normal  Conduction Disutrbances:none  Narrative Interpretation: Sinus rhythm 82 bpm without acute findings.  Old EKG Reviewed: unchanged  I have personally reviewed the EKG tracing and agree with the computerized printout as noted.  DG Chest 2 View  Result Date: 12/09/2020 CLINICAL DATA:  Chest pain, shortness of breath EXAM: CHEST - 2 VIEW  COMPARISON:  08/21/2020 FINDINGS: Heart and mediastinal contours are within normal limits. No focal opacities or effusions. No acute bony abnormality. IMPRESSION: No active cardiopulmonary disease. Electronically Signed   By: Charlett Nose M.D.   On: 12/09/2020 18:04    Assessment and Plan :   PDMP not reviewed this encounter.  1. Atypical chest pain   2. Right-sided chest pain   3. Acute bilateral thoracic back pain     Start famotidine to address GERD as source of his atypical chest pain. Recommended naproxen and tizanidine for musculoskeletal pain from his recent work out. Counseled patient on potential for adverse effects with medications prescribed/recommended today, ER and return-to-clinic precautions discussed, patient verbalized understanding.    Wallis Bamberg, [place] 12/09/20 1818

## 2021-03-16 ENCOUNTER — Ambulatory Visit: Payer: PRIVATE HEALTH INSURANCE | Admitting: Physician Assistant

## 2021-07-04 IMAGING — DX DG CHEST 2V
2 series · 2 of 2 positions shown · non-contrast
Comparison: 08/21/2020

CLINICAL DATA: Chest pain, shortness of breath

EXAM:
CHEST - 2 VIEW

[chest pa]
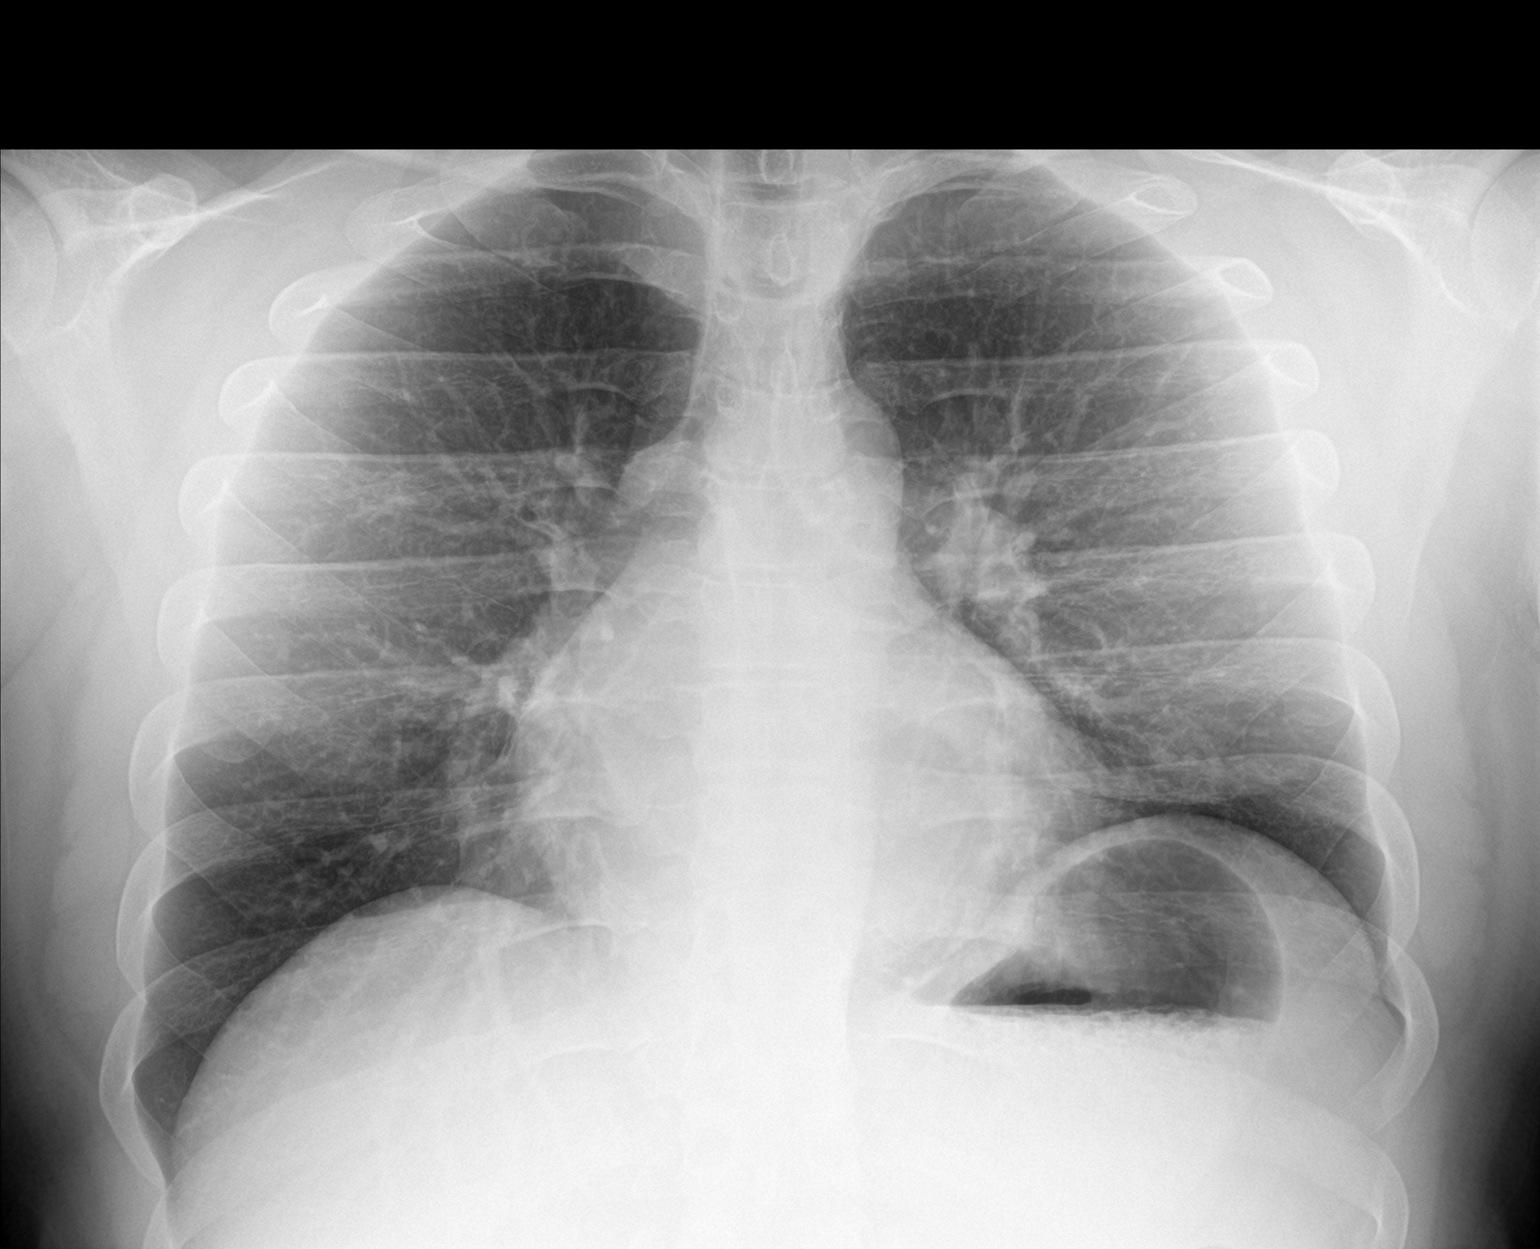

[chest lat]
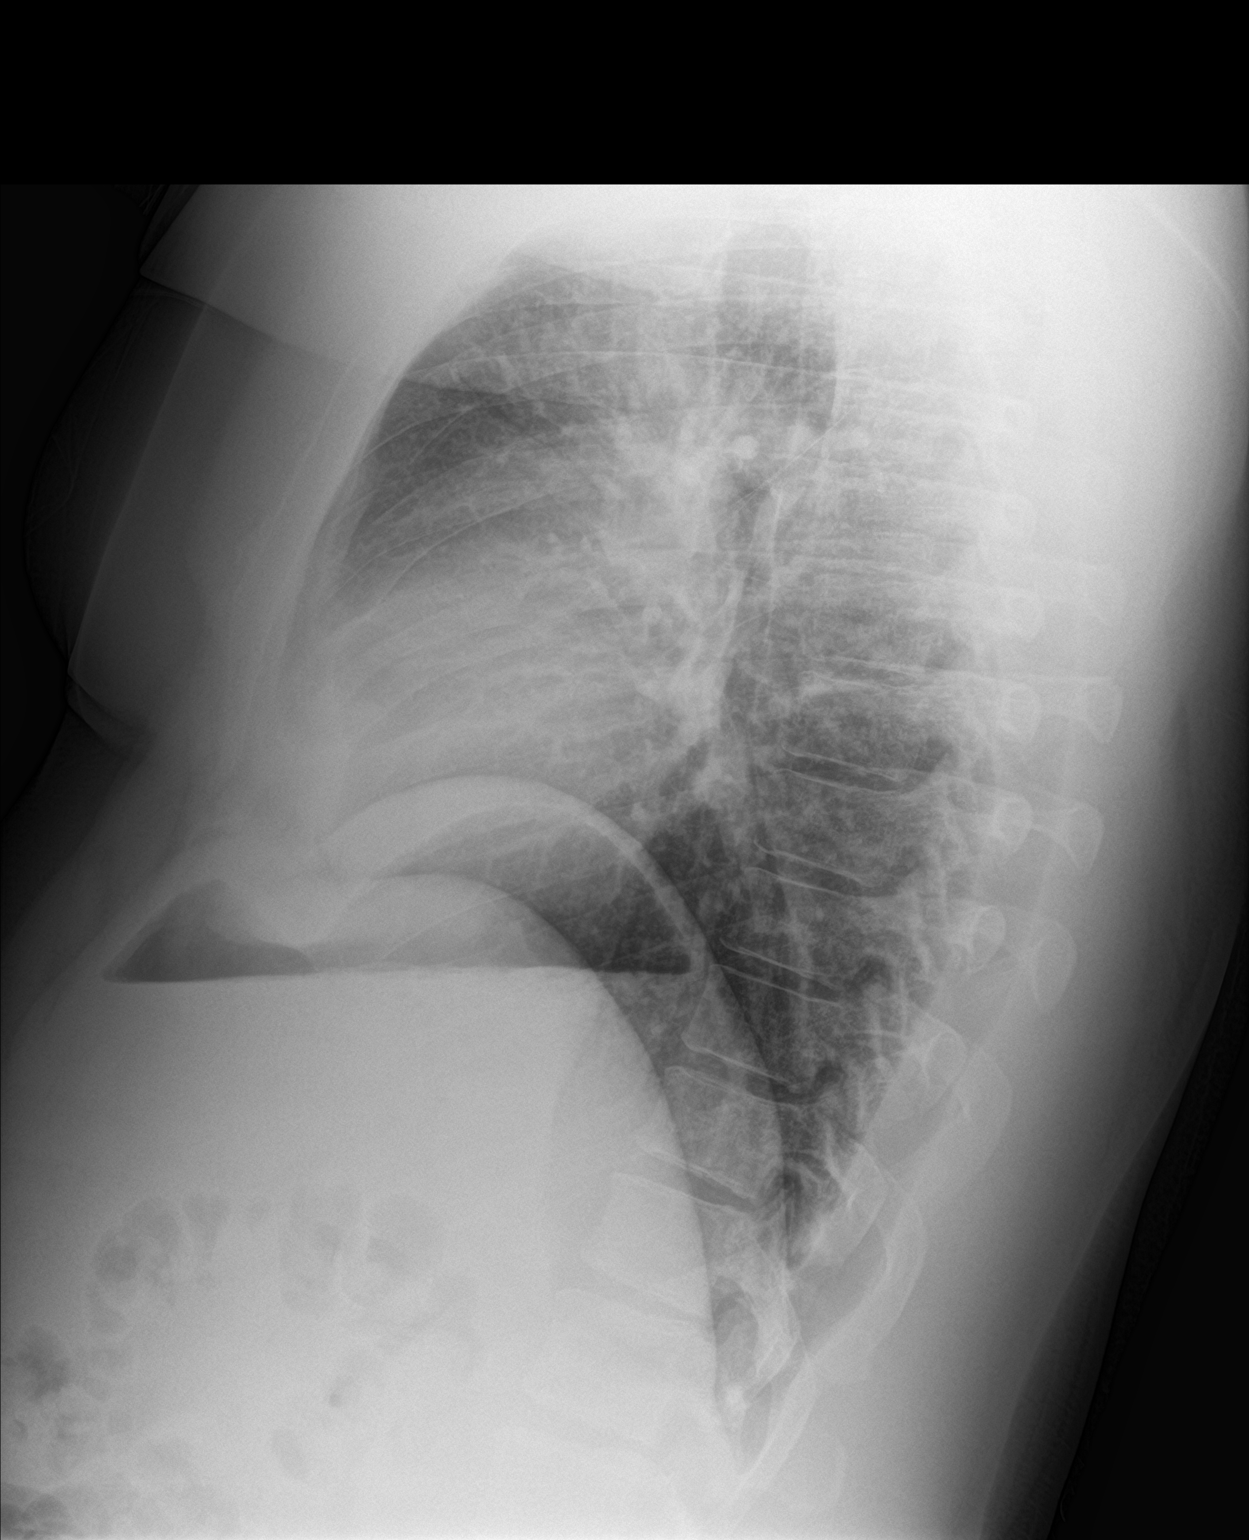

[2 of 2 positions shown; findings below may reference images not displayed]

FINDINGS: Heart and mediastinal contours are within normal limits. No focal
opacities or effusions. No acute bony abnormality.
IMPRESSION: No active cardiopulmonary disease.
